# Patient Record
Sex: Male | Born: 1965 | Race: White | Hispanic: No | Marital: Married | Smoking: Never smoker
Health system: Southern US, Community
[De-identification: ages and names within clinical notes are randomized; demographics above are authoritative.]

## PROBLEM LIST (undated history)

## (undated) DIAGNOSIS — N400 Enlarged prostate without lower urinary tract symptoms: Secondary | ICD-10-CM

## (undated) DIAGNOSIS — I10 Essential (primary) hypertension: Secondary | ICD-10-CM

## (undated) HISTORY — DX: Benign prostatic hyperplasia without lower urinary tract symptoms: N40.0

## (undated) HISTORY — PX: COLONOSCOPY: SHX174

## (undated) HISTORY — PX: PROSTATE BIOPSY: SHX241

## (undated) HISTORY — PX: VASECTOMY: SHX75

---

## 2008-09-17 ENCOUNTER — Ambulatory Visit: Payer: Self-pay | Admitting: Gastroenterology

## 2016-05-03 DIAGNOSIS — C801 Malignant (primary) neoplasm, unspecified: Secondary | ICD-10-CM

## 2016-05-03 HISTORY — DX: Malignant (primary) neoplasm, unspecified: C80.1

## 2016-05-11 ENCOUNTER — Encounter: Payer: Self-pay | Admitting: *Deleted

## 2016-05-12 ENCOUNTER — Other Ambulatory Visit (HOSPITAL_COMMUNITY): Payer: Self-pay | Admitting: Urology

## 2016-05-12 DIAGNOSIS — R972 Elevated prostate specific antigen [PSA]: Secondary | ICD-10-CM

## 2016-05-19 ENCOUNTER — Ambulatory Visit (HOSPITAL_COMMUNITY)
Admission: RE | Admit: 2016-05-19 | Discharge: 2016-05-19 | Disposition: A | Discharge status: Home / Self Care | Payer: BLUE CROSS/BLUE SHIELD | Source: Ambulatory Visit | Attending: Urology | Admitting: Urology

## 2016-05-19 DIAGNOSIS — R972 Elevated prostate specific antigen [PSA]: Secondary | ICD-10-CM | POA: Diagnosis present

## 2016-05-19 DIAGNOSIS — R9349 Abnormal radiologic findings on diagnostic imaging of other urinary organs: Secondary | ICD-10-CM | POA: Insufficient documentation

## 2016-05-19 MED ORDER — LIDOCAINE HCL 2 % EX GEL: 1.0000 "application " | Freq: Once | CUTANEOUS | Status: DC

## 2016-05-19 MED ORDER — LIDOCAINE HCL 2 % EX GEL
CUTANEOUS | Status: AC
  Filled 2016-05-19: qty 30

## 2016-05-19 MED ORDER — GADOBENATE DIMEGLUMINE 529 MG/ML IV SOLN
20.0000 mL | Freq: Once | INTRAVENOUS | Status: AC | PRN
  Administered 2016-05-19: 20 mL via INTRAVENOUS

## 2016-06-01 ENCOUNTER — Encounter: Payer: Self-pay | Admitting: General Surgery

## 2016-06-01 ENCOUNTER — Ambulatory Visit (INDEPENDENT_AMBULATORY_CARE_PROVIDER_SITE_OTHER): Payer: BLUE CROSS/BLUE SHIELD | Admitting: General Surgery

## 2016-06-01 VITALS — BP 168/88 | HR 78 | Resp 12 | Ht 71.0 in | Wt 218.0 lb

## 2016-06-01 DIAGNOSIS — K640 First degree hemorrhoids: Secondary | ICD-10-CM

## 2016-06-01 DIAGNOSIS — K602 Anal fissure, unspecified: Secondary | ICD-10-CM

## 2016-06-01 MED ORDER — HYDROCORTISONE ACETATE 25 MG RE SUPP: 25.0000 mg | Freq: Two times a day (BID) | RECTAL | 0 refills | Status: DC

## 2016-06-01 MED ORDER — HYDROCORTISONE ACE-PRAMOXINE 2.5-1 % RE CREA: 1.0000 "application " | TOPICAL_CREAM | Freq: Two times a day (BID) | RECTAL | 0 refills | Status: DC

## 2016-06-01 NOTE — Progress Notes (Signed)
Patient ID: Stephen Joyce, male   DOB: [birth year], 51 y.o.   MRN: [id number]  Chief Complaint  Patient presents with  . Rectal Pain    HPI Stephen Joyce is a 51 y.o. male.  here today for evaluation of possible fissure or hemorrhoids. He is having some rectal pain with burning minimal bleeding. He states his PSA has been elevated prostate biopsy July 2017 by Dr Yves Dill. This did not show evidence of malignancy.   He states that about 2 weeks after the biopsy is when his started having rectal pain.Marland Kitchen He states the pain would get better the it would start again. Pain typically starts during the bowel movement when he describes it as if he is passing broken glass, occasionally able start about 20-30 minutes after BM.Symptoms are worse with hard stools. Prostate MRI done 05-19-16 showed an area of questionable change potentially warranting repeat biopsy.  Bowels move regular but associated with pain. Long periods of sitting as when he drives to [facility] can be uncomfortable.  HPI  Past Medical History:  Diagnosis Date  . Enlarged prostate     History reviewed. No pertinent surgical history.  Family History  Problem Relation Age of Onset  . Prostate cancer Father     Social History Social History  Substance Use Topics  . Smoking status: Never Smoker  . Smokeless tobacco: Never Used  . Alcohol use Yes    No Known Allergies  Current Outpatient Prescriptions  Medication Sig Dispense Refill  . amLODipine-olmesartan (AZOR) 5-20 MG tablet daily.   1  . Nutritional Supplements (PROSTATE 2.4 PO) Take 2 tablets by mouth daily.    . Omega-3 Fatty Acids (THEROMEGA PO) Take 700 mg by mouth daily.    . simvastatin (ZOCOR) 20 MG tablet Take 20 mg by mouth daily at 6 PM.   1  . Soybean (ISOREL PO) Take 100 mg by mouth daily.    . hydrocortisone (ANUSOL-HC) 25 MG suppository Place 1 suppository (25 mg total) rectally 2 (two) times daily. 12 suppository 0  . hydrocortisone-pramoxine  (ANALPRAM HC) 2.5-1 % rectal cream Place 1 application rectally 2 (two) times daily. 30 g 0   No current facility-administered medications for this visit.     Review of Systems Review of Systems  Constitutional: Negative.   Respiratory: Negative.   Cardiovascular: Negative.   Gastrointestinal: Positive for rectal pain.    Blood pressure (!) 168/88, pulse 78, resp. rate 12, height 5\' 11"  (1.803 m), weight 218 lb (98.9 kg).  Physical Exam Physical Exam  Constitutional: He is oriented to person, place, and time. He appears well-developed and well-nourished.  HENT:  Mouth/Throat: Oropharynx is clear and moist.  Eyes: Conjunctivae are normal. No scleral icterus.  Neck: Neck supple.  Cardiovascular: Normal rate, regular rhythm and normal heart sounds.   Pulmonary/Chest: Effort normal and breath sounds normal.  Abdominal: Soft. Normal appearance.  Genitourinary: Rectal exam shows internal hemorrhoid and fissure.     Genitourinary Comments: Anoscopy was completed after 5 mL of 2% Xylocaine jelly. Mildly prominent internal hemorrhoids posteriorly. Prominent posterior fissure.  Lymphadenopathy:    He has no cervical adenopathy.  Neurological: He is alert and oriented to person, place, and time.  Skin: Skin is warm and dry.  Psychiatric: His behavior is normal.    Data Reviewed Pelvic MRI reported 05/19/2016.  Assessment    Posterior anal fissure, mild inflammation of the posterior internal hemorrhoids.    Plan    The patient has been  encouraged to make use of a fiber supplement to minimize hard stools. He'll apply Analpram cream to the external fissure 2-3 times per day. Anusol suppositories are being prescribed to resolve some of the inflammation above the level of the fissure.  Anticipated repeat exam in 3 weeks.  Surgical versus Botox options reviewed if conservative measures don't show study progress.       This information has been scribed by Karie Fetch RN,  BSN,BC.   Robert Bellow 06/01/2016, 11:41 AM

## 2016-06-01 NOTE — Patient Instructions (Addendum)
The patient is aware to call back for any questions or concerns.   Recommend fiber supplement to diet  Anal Fissure, Adult Introduction An anal fissure is a small tear or crack in the skin around the opening of the butt (anus).Bleeding from the tear or crack usually stops on its own within a few minutes. The bleeding may happen every time you poop (have a bowel movement) until the tear or crack heals. Follow these instructions at home: Eating and drinking  Avoid bananas and dairy products. These foods can make it hard to poop.  Drink enough fluid to keep your pee (urine) clear or pale yellow.  Eat a lot of fruit, whole grains, and vegetables. General instructions  Keep the butt area as clean and dry as you can.  Take a warm water bath (sitz bath) as told by your doctor. Do not use soap.  Take over-the-counter and prescription medicines only as told by your doctor.  Use creams or ointments only as told by your doctor.  Keep all follow-up visits as told by your doctor. This is important. Contact a doctor if:  You have more bleeding.  You have a fever.  You have watery poop (diarrhea) that is mixed with blood.  You have pain.  You problem gets worse, not better. This information is not intended to replace advice given to you by your health care provider. Make sure you discuss any questions you have with your health care provider. Document Released: 12/16/2010 Document Revised: 09/25/2015 Document Reviewed: 07/15/2014  2017 Elsevier

## 2016-06-16 ENCOUNTER — Ambulatory Visit: Payer: BLUE CROSS/BLUE SHIELD | Admitting: General Surgery

## 2016-08-12 ENCOUNTER — Encounter: Payer: Self-pay | Admitting: *Deleted

## 2016-08-31 ENCOUNTER — Inpatient Hospital Stay: Admission: RE | Admit: 2016-08-31 | Payer: BLUE CROSS/BLUE SHIELD | Source: Ambulatory Visit

## 2016-09-01 ENCOUNTER — Encounter
Admission: RE | Admit: 2016-09-01 | Discharge: 2016-09-01 | Disposition: A | Discharge status: Home / Self Care | Payer: BLUE CROSS/BLUE SHIELD | Source: Ambulatory Visit | Attending: Urology | Admitting: Urology

## 2016-09-01 HISTORY — DX: Essential (primary) hypertension: I10

## 2016-09-01 NOTE — Pre-Procedure Instructions (Signed)
COMPLETED PRE OP PHONE INTERVIEW BUT PT NEEDS TO COME IN FOR EKG DUE TO HTN-PT STATES HE IS OUT OF TOWN AND WILL NOT BE BACK UNTIL Sunday-PT WILL COME IN Monday (09-06-16) FOR EKG

## 2016-09-01 NOTE — Patient Instructions (Signed)
  Your procedure is scheduled on: 09-07-16 (Tuesday) Report to Same Day Surgery 2nd floor medical mall [facility] Entrance-take elevator on left to 2nd floor.  Check in with surgery information desk.) To find out your arrival time please call [phone] between 1PM - 3PM on 09-06-16 (Monday)  Remember: Instructions that are not followed completely may result in serious medical risk, up to and including death, or upon the discretion of your surgeon and anesthesiologist your surgery may need to be rescheduled.    _x___ 1. Do not eat food or drink liquids after midnight. No gum chewing or hard candies.     __x__ 2. No Alcohol for 24 hours before or after surgery.   __x__3. No Smoking for 24 prior to surgery.   ____  4. Bring all medications with you on the day of surgery if instructed.    __x__ 5. Notify your doctor if there is any change in your medical condition     (cold, fever, infections).     Do not wear jewelry, make-up, hairpins, clips or nail polish.  Do not wear lotions, powders, or perfumes. You may wear deodorant.  Do not shave 48 hours prior to surgery. Men may shave face and neck.  Do not bring valuables to the hospital.    [facility] is not responsible for any belongings or valuables.               Contacts, dentures or bridgework may not be worn into surgery.  Leave your suitcase in the car. After surgery it may be brought to your room.  For patients admitted to the hospital, discharge time is determined by your  treatment team.   Patients discharged the day of surgery will not be allowed to drive home.  You will need someone to drive you home and stay with you the night of your procedure.    Please read over the following fact sheets that you were given:     _x___ Take anti-hypertensive (unless it includes a diuretic), cardiac, seizure, asthma,     anti-reflux and psychiatric medicines. These include:  1. AZOR (AMLODIPINE-OLMESARTAN)  2. ZOCOR  (SIMVASTATIN)  3.  4.  5.  6.  _X___Fleets enema or Magnesium Citrate as directed-AT HOME 1 HOUR PRIOR TO [place]   ____ Use CHG Soap or sage wipes as directed on instruction sheet   ____ Use inhalers on the day of surgery and bring to hospital day of surgery  ____ Stop Metformin and Janumet 2 days prior to surgery.    ____ Take 1/2 of usual insulin dose the night before surgery and none on the morning  surgery.   ____ Follow recommendations from Cardiologist, Pulmonologist or PCP regarding stopping Aspirin, Coumadin, Pllavix ,Eliquis, Effient, or Pradaxa, and Pletal.  X____Stop Anti-inflammatories such as Advil, Aleve, Ibuprofen, Motrin, Naproxen, Naprosyn, Goodies powders or aspirin products NOW-OK to take Tylenol    _x___ Stop supplements until after surgery-STOP FISH OIL AND SOY NOW   ____ Bring C-Pap to the hospital.

## 2016-09-03 NOTE — H&P (Signed)
NAME:  Stephen Joyce NO.:  MEDICAL RECORD NO.:  [id number]  LOCATION:                                 FACILITY:  PHYSICIAN:  Maryan Puls          DATE OF BIRTH:  [birth year]  DATE OF ADMISSION: DATE OF DISCHARGE:                            HISTORY AND PHYSICAL   SAME-DAY SURGERY:  Sep 07, 2016.  CHIEF COMPLAINT:  Elevated PSA.  HISTORY OF PRESENT ILLNESS:  [name] is a 52 year old white male who presented with an elevated PSA of 6.8 ng/mL in May 2017.  PSA increased to 8.3 ng/mL on May 06, 2016.  The patient underwent transrectal ultrasound guided biopsy of the prostate in June 2017, which indicated a 23.5 g prostate with presence of atypia of the right lateral apex and right lateral midportion of the prostate.  He had a followup MRI scan of the prostate on May 19, 2016, which indicated a 9 mm PI- RADS grade 3 lesion of the left base.  The patient comes in now for targeted prostate biopsy using the UroNav system.  ALLERGIES:  NO DRUG ALLERGIES.  MEDICATIONS:  No medications.  SURGICAL HISTORY:  Bilateral vasectomy in 1994.  SOCIAL HISTORY:  The patient consumes 4-6 alcoholic beverages per week. Denies cigarette use.  FAMILY HISTORY:  Father is living at age 23 after treatment for prostate cancer.  Mother is living at age 52 without health problems.  PAST AND CURRENT MEDICAL CONDITIONS:  The patient denied heart disease, lung disease, diabetes, stroke, or hypertension.  REVIEW OF SYSTEMS:  Negative for prior urological disease, hematuria, dysuria, or difficulty voiding.  PHYSICAL EXAMINATION:  GENERALLY:  Well-nourished white male, in no acute distress. HEENT:  Sclerae were clear. NECK:  Supple.  No palpable cervical adenopathy. LUNGS:  Clear to auscultation. CARDIOVASCULAR:  Regular rhythm and rate without audible murmurs. ABDOMEN:  Soft, nontender abdomen. GU:  Circumcised.  Testes smooth, nontender, 20 mL size each. RECTAL:   Internal hemorrhoids.  Prostate gland 25 g, smooth, nontender. NEUROMUSCULAR:  Nonfocal.  IMPRESSION: 1. Elevated prostate-specific antigen. 2. History of atypia. 3. A 9 mm PI-RADS grade 3 lesion of the left base on CT scan.  PLAN:  Targeted biopsy using the UroNav system.          ______________________________ Maryan Puls     MW/MEDQ  D:  09/02/2016  T:  09/02/2016  Job:  [id number]

## 2016-09-06 ENCOUNTER — Encounter
Admission: RE | Admit: 2016-09-06 | Discharge: 2016-09-06 | Disposition: A | Discharge status: Home / Self Care | Payer: BLUE CROSS/BLUE SHIELD | Source: Ambulatory Visit | Attending: Urology | Admitting: Urology

## 2016-09-06 DIAGNOSIS — K649 Unspecified hemorrhoids: Secondary | ICD-10-CM | POA: Diagnosis not present

## 2016-09-06 DIAGNOSIS — R972 Elevated prostate specific antigen [PSA]: Secondary | ICD-10-CM | POA: Diagnosis not present

## 2016-09-06 DIAGNOSIS — N4289 Other specified disorders of prostate: Secondary | ICD-10-CM | POA: Diagnosis not present

## 2016-09-06 DIAGNOSIS — Z9852 Vasectomy status: Secondary | ICD-10-CM | POA: Diagnosis not present

## 2016-09-06 DIAGNOSIS — I10 Essential (primary) hypertension: Secondary | ICD-10-CM | POA: Diagnosis not present

## 2016-09-06 DIAGNOSIS — Z8042 Family history of malignant neoplasm of prostate: Secondary | ICD-10-CM | POA: Diagnosis not present

## 2016-09-07 ENCOUNTER — Ambulatory Visit: Payer: BLUE CROSS/BLUE SHIELD | Admitting: Anesthesiology

## 2016-09-07 ENCOUNTER — Ambulatory Visit
Admission: RE | Admit: 2016-09-07 | Discharge: 2016-09-07 | Disposition: A | Discharge status: Home / Self Care | Payer: BLUE CROSS/BLUE SHIELD | Source: Ambulatory Visit | Attending: Urology | Admitting: Urology

## 2016-09-07 ENCOUNTER — Encounter
Admission: RE | Disposition: A | Discharge status: Home / Self Care | Payer: Self-pay | Source: Ambulatory Visit | Attending: Urology

## 2016-09-07 DIAGNOSIS — I10 Essential (primary) hypertension: Secondary | ICD-10-CM | POA: Insufficient documentation

## 2016-09-07 DIAGNOSIS — R972 Elevated prostate specific antigen [PSA]: Secondary | ICD-10-CM | POA: Insufficient documentation

## 2016-09-07 DIAGNOSIS — C61 Malignant neoplasm of prostate: Secondary | ICD-10-CM | POA: Insufficient documentation

## 2016-09-07 DIAGNOSIS — N4289 Other specified disorders of prostate: Secondary | ICD-10-CM | POA: Insufficient documentation

## 2016-09-07 DIAGNOSIS — Z9852 Vasectomy status: Secondary | ICD-10-CM | POA: Insufficient documentation

## 2016-09-07 DIAGNOSIS — K649 Unspecified hemorrhoids: Secondary | ICD-10-CM | POA: Insufficient documentation

## 2016-09-07 DIAGNOSIS — Z8042 Family history of malignant neoplasm of prostate: Secondary | ICD-10-CM | POA: Insufficient documentation

## 2016-09-07 HISTORY — PX: PROSTATE BIOPSY: SHX241

## 2016-09-07 SURGERY — BIOPSY, PROSTATE
Anesthesia: General

## 2016-09-07 MED ORDER — FLEET ENEMA 7-19 GM/118ML RE ENEM
1.0000 | ENEMA | Freq: Once | RECTAL | Status: AC
  Administered 2016-09-07: 1 via RECTAL

## 2016-09-07 MED ORDER — LACTATED RINGERS IV SOLN
INTRAVENOUS | Status: DC
  Administered 2016-09-07: 13:00:00 via INTRAVENOUS

## 2016-09-07 MED ORDER — DOCUSATE SODIUM 100 MG PO CAPS: 200.0000 mg | ORAL_CAPSULE | Freq: Two times a day (BID) | ORAL | 3 refills | Status: DC

## 2016-09-07 MED ORDER — LEVOFLOXACIN 500 MG PO TABS: 500.0000 mg | ORAL_TABLET | Freq: Every day | ORAL | 0 refills | Status: AC

## 2016-09-07 MED ORDER — PROPOFOL 500 MG/50ML IV EMUL
INTRAVENOUS | Status: DC | PRN
  Administered 2016-09-07: 120 ug/kg/min via INTRAVENOUS

## 2016-09-07 MED ORDER — FAMOTIDINE 20 MG PO TABS
ORAL_TABLET | ORAL | Status: AC
  Administered 2016-09-07: 20 mg via ORAL
  Filled 2016-09-07: qty 1

## 2016-09-07 MED ORDER — MIDAZOLAM HCL 2 MG/2ML IJ SOLN
INTRAMUSCULAR | Status: AC
  Filled 2016-09-07: qty 2

## 2016-09-07 MED ORDER — LIDOCAINE HCL 2 % EX GEL
CUTANEOUS | Status: AC
  Filled 2016-09-07: qty 10

## 2016-09-07 MED ORDER — LIDOCAINE HCL (CARDIAC) 20 MG/ML IV SOLN
INTRAVENOUS | Status: DC | PRN
  Administered 2016-09-07: 60 mg via INTRAVENOUS

## 2016-09-07 MED ORDER — GENTAMICIN SULFATE 40 MG/ML IJ SOLN
80.0000 mg | Freq: Once | INTRAVENOUS | Status: AC
  Administered 2016-09-07: 80 mg via INTRAVENOUS
  Filled 2016-09-07: qty 2

## 2016-09-07 MED ORDER — FENTANYL CITRATE (PF) 100 MCG/2ML IJ SOLN: 25.0000 ug | INTRAMUSCULAR | Status: DC | PRN

## 2016-09-07 MED ORDER — PROPOFOL 500 MG/50ML IV EMUL
INTRAVENOUS | Status: AC
  Filled 2016-09-07: qty 50

## 2016-09-07 MED ORDER — PROPOFOL 10 MG/ML IV BOLUS
INTRAVENOUS | Status: DC | PRN
  Administered 2016-09-07: 40 mg via INTRAVENOUS

## 2016-09-07 MED ORDER — HYDROCODONE-ACETAMINOPHEN 10-325 MG PO TABS: 1.0000 | ORAL_TABLET | ORAL | 0 refills | Status: DC | PRN

## 2016-09-07 MED ORDER — LIDOCAINE HCL (PF) 2 % IJ SOLN
INTRAMUSCULAR | Status: AC
  Filled 2016-09-07: qty 2

## 2016-09-07 MED ORDER — ONDANSETRON HCL 4 MG/2ML IJ SOLN: 4.0000 mg | Freq: Once | INTRAMUSCULAR | Status: DC | PRN

## 2016-09-07 MED ORDER — MIDAZOLAM HCL 2 MG/2ML IJ SOLN
INTRAMUSCULAR | Status: DC | PRN
  Administered 2016-09-07: .5 mg via INTRAVENOUS
  Administered 2016-09-07: 2 mg via INTRAVENOUS
  Administered 2016-09-07: .5 mg via INTRAVENOUS
  Administered 2016-09-07: 1 mg via INTRAVENOUS

## 2016-09-07 MED ORDER — PROPOFOL 10 MG/ML IV BOLUS
INTRAVENOUS | Status: AC
  Filled 2016-09-07: qty 20

## 2016-09-07 MED ORDER — FAMOTIDINE 20 MG PO TABS
20.0000 mg | ORAL_TABLET | Freq: Once | ORAL | Status: AC
  Administered 2016-09-07: 20 mg via ORAL

## 2016-09-07 SURGICAL SUPPLY — 27 items
BAG URO DRAIN 2000ML W/SPOUT (MISCELLANEOUS) IMPLANT
BLADE CLIPPER SURG (BLADE) IMPLANT
COVER TRANSDUCER ULTRASOUND (MISCELLANEOUS) ×2 IMPLANT
DRAPE SHEET LG 3/4 BI-LAMINATE (DRAPES) IMPLANT
DRSG TELFA 3X8 NADH (GAUZE/BANDAGES/DRESSINGS) IMPLANT
DRSG TELFA 4X3 1S NADH ST (GAUZE/BANDAGES/DRESSINGS) IMPLANT
GAUZE SPONGE 4X4 12PLY STRL (GAUZE/BANDAGES/DRESSINGS) IMPLANT
GLOVE BIO SURGEON STRL SZ 6.5 (GLOVE) IMPLANT
GLOVE BIO SURGEON STRL SZ7 (GLOVE) IMPLANT
GLOVE BIO SURGEONS STRL SZ 6.5 (GLOVE)
GUIDE NEEDLE ENDOCAV 16-18 CVR (NEEDLE) ×3 IMPLANT
INST BIOPSY MAXCORE 18GX25 (NEEDLE) ×3 IMPLANT
KIT RM TURNOVER CYSTO AR (KITS) ×3 IMPLANT
NDL DEFLUX 3.7X23X350 (MISCELLANEOUS)
NDL SAFETY 18GX1.5 (NEEDLE) IMPLANT
NDL SAFETY 22GX1.5 (NEEDLE) IMPLANT
NEEDLE BIO TRUPATH DISP 18GX25 (MISCELLANEOUS) ×6 IMPLANT
NEEDLE DEFLUX 3.7X23X350 (MISCELLANEOUS) IMPLANT
NEEDLE GUIDE BIOPSY 644068 (NEEDLE) IMPLANT
PAD DRESSING TELFA 3X8 NADH (GAUZE/BANDAGES/DRESSINGS) IMPLANT
PROBE URONAV BK 8808E 8818 HLD (MISCELLANEOUS) ×1 IMPLANT
SYRINGE 10CC LL (SYRINGE) IMPLANT
SYRINGE IRR TOOMEY STRL 70CC (SYRINGE) IMPLANT
URONAV BK 8808E 8818 PROBE HLD (MISCELLANEOUS) ×2
URONAV MRI FUSION TWO PATIENTS (MISCELLANEOUS) ×3 IMPLANT
URONAV ULTRASOUND (MISCELLANEOUS) ×2 IMPLANT
WATER STERILE IRR 1000ML POUR (IV SOLUTION) IMPLANT

## 2016-09-07 NOTE — Anesthesia Post-op Follow-up Note (Cosign Needed)
Anesthesia QCDR form completed.        

## 2016-09-07 NOTE — Discharge Instructions (Signed)
AMBULATORY SURGERY  DISCHARGE INSTRUCTIONS   1) The drugs that you were given will stay in your system until tomorrow so for the next 24 hours you should not:  A) Drive an automobile B) Make any legal decisions C) Drink any alcoholic beverage   2) You may resume regular meals tomorrow.  Today it is better to start with liquids and gradually work up to solid foods.  You may eat anything you prefer, but it is better to start with liquids, then soup and crackers, and gradually work up to solid foods.   3) Please notify your doctor immediately if you have any unusual bleeding, trouble breathing, redness and pain at the surgery site, drainage, fever, or pain not relieved by medication. 4)   5) Your post-operative visit with Dr.                                     is: Date:                        Time:    Please call to schedule your post-operative visit.  6) Additional Instructions:        Transrectal Ultrasound-Guided Biopsy A transrectal ultrasound-guided biopsy is a procedure to remove samples of tissue from your prostate using ultrasound images to guide the procedure. The procedure is usually done to evaluate the prostate gland of men who have an elevated prostate-specific antigen (PSA). PSA is a blood test to screen for prostate cancer. The biopsy samples are taken to check for prostate cancer. Tell a health care provider about:  Any allergies you have.  All medicines you are taking, including vitamins, herbs, eye drops, creams, and over-the-counter medicines.  Any problems you or family members have had with anesthetic medicines.  Any blood disorders you have.  Any surgeries you have had.  Any medical conditions you have. What are the risks? Generally, this is a safe procedure. However, as with any procedure, problems can occur. Possible problems include:  Infection of your prostate.  Bleeding from your rectum or blood in your urine.  Difficulty  urinating.  Nerve damage (this is usually temporary).  Damage to surrounding structures such as blood vessels, organs, and muscles, which would require other procedures. What happens before the procedure?  Do not eat or drink anything after midnight on the night before the procedure or as directed by your health care provider.  Take medicines only as directed by your health care provider.  Your health care provider may have you stop taking certain medicines 5-7 days before the procedure.  You will be given an enema before the procedure. During an enema, a liquid is injected into your rectum to clear out waste.  You may have lab tests the day of your procedure.  Plan to have someone take you home after the procedure. What happens during the procedure?  You will be given medicine to help you relax (sedative) before the procedure. An IV tube will be inserted into one of your veins and used to give fluids and medicine.  You will be given antibiotic medicine to reduce the risk of an infection.  You will be placed on your side for the procedure.  A probe with lubricated gel will be placed into your rectum, and images will be taken of your prostate and surrounding structures.  Numbing medicine will be injected into the prostate before  the biopsy samples are taken.  A biopsy needle will then be inserted and guided to your prostate with the use of the ultrasound images.  Samples of prostate tissue will be taken, and the needle will then be removed.  The biopsy samples will be sent to a lab to be analyzed. Results are usually back in 2-3 days. What happens after the procedure?  You will be taken to a recovery area where you will be monitored.  You may have some discomfort in the rectal area. You will be given pain medicines to control this.  You may be allowed to go home the same day, or you may need to stay in the hospital overnight. This information is not intended to replace advice  given to you by your health care provider. Make sure you discuss any questions you have with your health care provider. Document Released: 09/03/2013 Document Revised: 09/25/2015 Document Reviewed: 12/06/2012 Elsevier Interactive Patient Education  2017 [employer].

## 2016-09-07 NOTE — Anesthesia Postprocedure Evaluation (Signed)
Anesthesia Post Note  Patient: Stephen Joyce  Procedure(s) Performed: Procedure(s) (LRB): PROSTATE BIOPSY WITH URO NAVIGATION (N/A)  Patient location during evaluation: PACU Anesthesia Type: General Level of consciousness: awake and alert and oriented Pain management: pain level controlled Vital Signs Assessment: post-procedure vital signs reviewed and stable Respiratory status: spontaneous breathing Cardiovascular status: blood pressure returned to baseline Anesthetic complications: no     Last Vitals:  Vitals:   09/07/16 1523 09/07/16 1538  BP: 102/69 104/64  Pulse: (!) 55 60  Resp: 12 16  Temp: 36.2 C     Last Pain:  Vitals:   09/07/16 1538  TempSrc:   PainSc: 0-No pain                 Darica Goren

## 2016-09-07 NOTE — OR Nursing (Signed)
[place] regional rep and technician in the [place] Dr. Yves Dill.  Kandy Garrison and [employer]. Dr. Yves Dill brought his own pre labeled specimen kit. He took 13 specimens and put a specimen in each container. Dr. Yves Dill carried all the specimens with him when he left the OR.

## 2016-09-07 NOTE — Op Note (Signed)
Preoperative diagnosis:1. 9 mm PI RADS grade 3 lesion left base                                           2. Elevated PSA                                           3. Atypia of the right lateral apex and right lateral                                                        midportion of the prostate  Postoperative diagnosis: Same   Procedure: Fusion MRI transrectal ultrasound-guided prostate biopsy using the UroNav guidance system    Surgeon: Otelia Limes. Yves Dill MD  Anesthesia: General  Indications:See the history and physical. After informed consent the above procedure(s) were requested     Technique and findings: After adequate general anesthesia been obtained patient was placed into the lateral decubitus position and finger sweep of the rectal vault performed. The rectal vault was clear. The transrectal ultrasound probe was then placed and transverse and sagittal ultrasound images acquired. The MRI images were then superimposed upon the ultrasound images and the region of interest at the left base was identified. Three core biopsies were obtained of this region. At this point standard 12 core regional biopsies were obtained. At this point the ultrasound probe was removed and procedure terminated. The patient tolerated procedure well and was transferred to the recovery room stable condition. Blood loss was minimal.

## 2016-09-07 NOTE — H&P (Signed)
Date of Initial H&P: 09/02/16  History reviewed, patient examined, no change in status, stable for surgery.

## 2016-09-07 NOTE — Transfer of Care (Signed)
Immediate Anesthesia Transfer of Care Note  Patient: Stephen Joyce  Procedure(s) Performed: Procedure(s): PROSTATE BIOPSY WITH URO NAVIGATION (N/A)  Patient Location: PACU  Anesthesia Type:General  Level of Consciousness: sedated  Airway & Oxygen Therapy: Patient Spontanous Breathing and Patient connected to face mask oxygen  Post-op Assessment: Report given to RN and Post -op Vital signs reviewed and stable  Post vital signs: Reviewed and stable  Last Vitals:  Vitals:   09/07/16 1318 09/07/16 1523  BP: (!) 155/84 102/69  Pulse:    Resp:  12  Temp:  36.2 C    Last Pain:  Vitals:   09/07/16 1317  TempSrc: Tympanic         Complications: No apparent anesthesia complications

## 2016-09-07 NOTE — Anesthesia Preprocedure Evaluation (Addendum)
Anesthesia Evaluation  Patient identified by MRN, date of birth, ID band Patient awake    Reviewed: Allergy & Precautions, NPO status , Patient's Chart, lab work & pertinent test results  Airway Mallampati: II  TM Distance: <3 FB     Dental no notable dental hx.    Pulmonary neg pulmonary ROS,    Pulmonary exam normal        Cardiovascular hypertension, Pt. on medications Normal cardiovascular exam     Neuro/Psych negative neurological ROS  negative psych ROS   GI/Hepatic Neg liver ROS, hemorrhoids   Endo/Other  negative endocrine ROS  Renal/GU negative Renal ROS     Musculoskeletal negative musculoskeletal ROS (+)   Abdominal Normal abdominal exam  (+)   Peds negative pediatric ROS (+)  Hematology negative hematology ROS (+)   Anesthesia Other Findings Past Medical History: No date: Enlarged prostate No date: Hypertension  Reproductive/Obstetrics                            Anesthesia Physical Anesthesia Plan  ASA: II  Anesthesia Plan: General   Post-op Pain Management:    Induction: Intravenous  Airway Management Planned: Nasal Cannula  Additional Equipment:   Intra-op Plan:   Post-operative Plan:   Informed Consent: I have reviewed the patients History and Physical, chart, labs and discussed the procedure including the risks, benefits and alternatives for the proposed anesthesia with the patient or authorized representative who has indicated his/her understanding and acceptance.   Dental advisory given  Plan Discussed with: CRNA and Surgeon  Anesthesia Plan Comments:         Anesthesia Quick Evaluation

## 2016-09-08 ENCOUNTER — Encounter: Payer: Self-pay | Admitting: Urology

## 2016-09-09 ENCOUNTER — Encounter: Payer: Self-pay | Admitting: Urology

## 2017-01-19 NOTE — H&P (Signed)
NAMEANDERS, HOHMANN NO.:  [id number]  MEDICAL RECORD NO.:  [id number]  LOCATION:                                 FACILITY:  PHYSICIAN:  Maryan Puls          DATE OF BIRTH:  [birth year]  DATE OF ADMISSION: DATE OF DISCHARGE:                            HISTORY AND PHYSICAL   SAME-DAY SURGERY:  January 20, 2017.  CHIEF COMPLAINT:  Difficulty voiding.  HISTORY OF PRESENT ILLNESS:  Mr. 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  He developed urethral stricture disease, which was treated with dilatation on August 30th.  Stricture has recurred and he comes in now for cysto with internal urethrotomy using holmium laser and dilatation.  He is having difficulty voiding at the present time.  ALLERGIES:  NO DRUG ALLERGIES.  MEDICATIONS:  No medications.  SURGICAL HISTORY:  Bilateral vasectomy in 1994.  SOCIAL HISTORY:  The patient consumes 4-6 alcoholic beverages per week. He denied cigarette use.  FAMILY HISTORY:  Father is living at age 46 after treatment for prostate cancer.  Mother is living, age 36, without any significant health problems.  PAST AND CURRENT MEDICAL CONDITIONS:  The patient denied history of heart disease, lung disease, diabetes, or hypertension.  REVIEW OF SYSTEMS:  The patient denied chest pain, shortness of breath, diarrhea, dysuria, or hematuria.  PHYSICAL EXAMINATION:  GENERAL:  A well-nourished white male, in no acute distress. HEENT:  Sclerae were clear. NECK:  Supple.  No palpable cervical adenopathy. LUNGS:  Clear to auscultation. CARDIOVASCULAR:  Regular rhythm and rate without audible murmurs. ABDOMEN:  Soft, nontender abdomen. GENITOURINARY:  Circumcised.  Testes smooth and nontender, 20 mL size each. RECTAL:  Internal hemorrhoids.  Prostate gland was less than 10 g, smooth and  nontender. NEUROMUSCULAR:  Nonfocal.  IMPRESSION:  Urethral stricture disease.  PLAN:  Cysto with internal urethrotomy using holmium laser and urethral dilatation.    ______________________________ Maryan Puls   ______________________________ Maryan Puls    MW/MEDQ  D:  01/19/2017  T:  01/19/2017  Job:  [id number]

## 2017-01-20 ENCOUNTER — Encounter
Admission: RE | Disposition: A | Discharge status: Home / Self Care | Payer: Self-pay | Source: Ambulatory Visit | Attending: Urology

## 2017-01-20 ENCOUNTER — Ambulatory Visit
Admission: RE | Admit: 2017-01-20 | Discharge: 2017-01-20 | Disposition: A | Discharge status: Home / Self Care | Payer: BLUE CROSS/BLUE SHIELD | Source: Ambulatory Visit | Attending: Urology | Admitting: Urology

## 2017-01-20 ENCOUNTER — Ambulatory Visit: Payer: BLUE CROSS/BLUE SHIELD | Admitting: Anesthesiology

## 2017-01-20 ENCOUNTER — Encounter: Payer: Self-pay | Admitting: *Deleted

## 2017-01-20 DIAGNOSIS — C61 Malignant neoplasm of prostate: Secondary | ICD-10-CM | POA: Diagnosis not present

## 2017-01-20 DIAGNOSIS — N359 Urethral stricture, unspecified: Secondary | ICD-10-CM | POA: Insufficient documentation

## 2017-01-20 DIAGNOSIS — N320 Bladder-neck obstruction: Secondary | ICD-10-CM | POA: Insufficient documentation

## 2017-01-20 HISTORY — PX: HOLMIUM LASER APPLICATION: SHX5852

## 2017-01-20 HISTORY — PX: CYSTOSCOPY WITH DIRECT VISION INTERNAL URETHROTOMY: SHX6637

## 2017-01-20 SURGERY — CYSTOSCOPY, WITH DIRECT VISION INTERNAL URETHROTOMY
Anesthesia: General | Wound class: Clean Contaminated

## 2017-01-20 MED ORDER — FENTANYL CITRATE (PF) 100 MCG/2ML IJ SOLN
INTRAMUSCULAR | Status: AC
  Filled 2017-01-20: qty 2

## 2017-01-20 MED ORDER — LIDOCAINE HCL 2 % EX GEL
CUTANEOUS | Status: DC | PRN
  Administered 2017-01-20: 1 via URETHRAL

## 2017-01-20 MED ORDER — MIDAZOLAM HCL 2 MG/2ML IJ SOLN
INTRAMUSCULAR | Status: AC
  Filled 2017-01-20: qty 2

## 2017-01-20 MED ORDER — URIBEL 118 MG PO CAPS: 1.0000 | ORAL_CAPSULE | Freq: Four times a day (QID) | ORAL | 3 refills | Status: DC | PRN

## 2017-01-20 MED ORDER — FENTANYL CITRATE (PF) 100 MCG/2ML IJ SOLN
INTRAMUSCULAR | Status: DC | PRN
  Administered 2017-01-20 (×2): 25 ug via INTRAVENOUS
  Administered 2017-01-20: 50 ug via INTRAVENOUS

## 2017-01-20 MED ORDER — CEFAZOLIN SODIUM-DEXTROSE 1-4 GM/50ML-% IV SOLN
1.0000 g | Freq: Once | INTRAVENOUS | Status: AC
  Administered 2017-01-20: 1 g via INTRAVENOUS

## 2017-01-20 MED ORDER — KETOROLAC TROMETHAMINE 30 MG/ML IJ SOLN
INTRAMUSCULAR | Status: AC
  Filled 2017-01-20: qty 1

## 2017-01-20 MED ORDER — DEXAMETHASONE SODIUM PHOSPHATE 10 MG/ML IJ SOLN
INTRAMUSCULAR | Status: DC | PRN
  Administered 2017-01-20: 10 mg via INTRAVENOUS

## 2017-01-20 MED ORDER — EPHEDRINE SULFATE 50 MG/ML IJ SOLN
INTRAMUSCULAR | Status: DC | PRN
  Administered 2017-01-20 (×2): 10 mg via INTRAVENOUS

## 2017-01-20 MED ORDER — ONDANSETRON HCL 4 MG/2ML IJ SOLN
INTRAMUSCULAR | Status: DC | PRN
  Administered 2017-01-20: 4 mg via INTRAVENOUS

## 2017-01-20 MED ORDER — LIDOCAINE HCL 2 % EX GEL
CUTANEOUS | Status: AC
  Filled 2017-01-20: qty 10

## 2017-01-20 MED ORDER — CEFAZOLIN SODIUM-DEXTROSE 1-4 GM/50ML-% IV SOLN
INTRAVENOUS | Status: AC
  Filled 2017-01-20: qty 50

## 2017-01-20 MED ORDER — KETOROLAC TROMETHAMINE 30 MG/ML IJ SOLN
INTRAMUSCULAR | Status: DC | PRN
  Administered 2017-01-20: 30 mg via INTRAVENOUS

## 2017-01-20 MED ORDER — LACTATED RINGERS IV SOLN
INTRAVENOUS | Status: DC
  Administered 2017-01-20: 15:00:00 via INTRAVENOUS

## 2017-01-20 MED ORDER — ONDANSETRON HCL 4 MG/2ML IJ SOLN
INTRAMUSCULAR | Status: AC
  Filled 2017-01-20: qty 2

## 2017-01-20 MED ORDER — DEXAMETHASONE SODIUM PHOSPHATE 10 MG/ML IJ SOLN
INTRAMUSCULAR | Status: AC
  Filled 2017-01-20: qty 1

## 2017-01-20 MED ORDER — PROPOFOL 10 MG/ML IV BOLUS
INTRAVENOUS | Status: DC | PRN
  Administered 2017-01-20: 200 mg via INTRAVENOUS

## 2017-01-20 MED ORDER — OXYCODONE-ACETAMINOPHEN 10-325 MG PO TABS: 1.0000 | ORAL_TABLET | ORAL | 0 refills | Status: DC | PRN

## 2017-01-20 MED ORDER — SULFAMETHOXAZOLE-TRIMETHOPRIM 800-160 MG PO TABS: 1.0000 | ORAL_TABLET | Freq: Two times a day (BID) | ORAL | 0 refills | Status: AC

## 2017-01-20 MED ORDER — LIDOCAINE HCL (CARDIAC) 20 MG/ML IV SOLN
INTRAVENOUS | Status: DC | PRN
  Administered 2017-01-20: 80 mg via INTRAVENOUS

## 2017-01-20 MED ORDER — PROPOFOL 10 MG/ML IV BOLUS
INTRAVENOUS | Status: AC
  Filled 2017-01-20: qty 20

## 2017-01-20 MED ORDER — LIDOCAINE HCL (PF) 2 % IJ SOLN
INTRAMUSCULAR | Status: AC
  Filled 2017-01-20: qty 2

## 2017-01-20 MED ORDER — FAMOTIDINE 20 MG PO TABS
ORAL_TABLET | ORAL | Status: AC
  Filled 2017-01-20: qty 1

## 2017-01-20 MED ORDER — FAMOTIDINE 20 MG PO TABS
20.0000 mg | ORAL_TABLET | Freq: Once | ORAL | Status: AC
  Administered 2017-01-20: 20 mg via ORAL

## 2017-01-20 MED ORDER — EPHEDRINE SULFATE 50 MG/ML IJ SOLN
INTRAMUSCULAR | Status: AC
  Filled 2017-01-20: qty 1

## 2017-01-20 MED ORDER — MIDAZOLAM HCL 2 MG/2ML IJ SOLN
INTRAMUSCULAR | Status: DC | PRN
  Administered 2017-01-20: 2 mg via INTRAVENOUS

## 2017-01-20 SURGICAL SUPPLY — 20 items
BAG DRAIN CYSTO-URO LG1000N (MISCELLANEOUS) ×3 IMPLANT
BAG URO DRAIN 2000ML W/SPOUT (MISCELLANEOUS) ×3 IMPLANT
CATH COUNCIL 22FR (CATHETERS) ×3 IMPLANT
CATH FOLEY 2WAY  5CC 20FR SIL (CATHETERS) ×2
CATH FOLEY 2WAY 5CC 20FR SIL (CATHETERS) ×1 IMPLANT
GLOVE BIO SURGEON STRL SZ7 (GLOVE) ×6 IMPLANT
GLOVE BIO SURGEON STRL SZ7.5 (GLOVE) ×3 IMPLANT
GLOVE BIOGEL PI IND STRL 7.0 (GLOVE) ×1 IMPLANT
GLOVE BIOGEL PI INDICATOR 7.0 (GLOVE) ×2
GOWN STRL REUS W/ TWL LRG LVL3 (GOWN DISPOSABLE) ×1 IMPLANT
GOWN STRL REUS W/ TWL XL LVL3 (GOWN DISPOSABLE) ×1 IMPLANT
GOWN STRL REUS W/TWL LRG LVL3 (GOWN DISPOSABLE) ×2
GOWN STRL REUS W/TWL XL LVL3 (GOWN DISPOSABLE) ×2
KIT RM TURNOVER CYSTO AR (KITS) ×3 IMPLANT
PACK CYSTO AR (MISCELLANEOUS) ×3 IMPLANT
SCALPEL LANCET BLADE (MISCELLANEOUS) ×3 IMPLANT
SENSOR STRAIGHT TIP WIRE ×3 IMPLANT
SET CYSTO W/LG BORE CLAMP LF (SET/KITS/TRAYS/PACK) ×3 IMPLANT
WATER STERILE IRR 1000ML POUR (IV SOLUTION) ×3 IMPLANT
WATER STERILE IRR 3000ML UROMA (IV SOLUTION) ×3 IMPLANT

## 2017-01-20 NOTE — Anesthesia Post-op Follow-up Note (Signed)
Anesthesia QCDR form completed.        

## 2017-01-20 NOTE — OR Nursing (Signed)
[place] Catheter attached to foley bag

## 2017-01-20 NOTE — Transfer of Care (Signed)
Immediate Anesthesia Transfer of Care Note  Patient: Stephen Joyce  Procedure(s) Performed: Procedure(s): CYSTOSCOPY INTERNAL URETHROTOMY (N/A) HOLMIUM LASER APPLICATION (N/A)  Patient Location: PACU  Anesthesia Type:General  Level of Consciousness: sedated  Airway & Oxygen Therapy: Patient Spontanous Breathing and Patient connected to face mask oxygen  Post-op Assessment: Report given to RN and Post -op Vital signs reviewed and stable  Post vital signs: Reviewed and stable  Last Vitals:  Vitals:   01/20/17 1403 01/20/17 1709  BP: (!) 157/84 113/63  Pulse: 69 (!) 58  Resp: 16 11  Temp: (!) 36.3 C (!) 36.3 C  SpO2: 99% 100%    Last Pain:  Vitals:   01/20/17 1403  TempSrc: Tympanic         Complications: No apparent anesthesia complications

## 2017-01-20 NOTE — Anesthesia Procedure Notes (Signed)
Procedure Name: LMA Insertion Performed by: Jaiden Wahab Pre-anesthesia Checklist: Patient identified, Patient being monitored, Timeout performed, Emergency Drugs available and Suction available Patient Re-evaluated:Patient Re-evaluated prior to induction Oxygen Delivery Method: Circle system utilized Preoxygenation: Pre-oxygenation with 100% oxygen Induction Type: IV induction Ventilation: Mask ventilation without difficulty LMA: LMA inserted LMA Size: 4.0 Tube type: Oral Number of attempts: 1 Placement Confirmation: positive ETCO2 and breath sounds checked- equal and bilateral Tube secured with: Tape Dental Injury: Teeth and Oropharynx as per pre-operative assessment        

## 2017-01-20 NOTE — Op Note (Signed)
Preoperative diagnosis: 1. Bladder neck contracture                                             2. Urethral stricture disease  Postoperative diagnosis: Same   Procedure: 1. Incision of bladder neck contracture with holmium laser                      2 incision of urethral stricture with holmium laser                      3. Foley catheter placement    Surgeon: Otelia Limes. Yves Dill MD  Anesthesia: General  Indications:See the history and physical. After informed consent the above procedure(s) were requested     Technique and findings: After adequate general anesthesia been obtained the patient was placed into dorsal lithotomy position and the perineum was prepped and draped in the usual fashion. The 1 French cystoscope was coupled to the camera and visually advanced into the bladder. Stricture was encountered in the prostatic urethra and bladder neck. However the caliber allowed passage of the 21 scope. A 0.0 35 guidewire was advanced into the bladder. The cystoscope was removed and then passed back into the bladder alongside the guidewire. The 1000  holmium laser fiber was then passed through the scope and set at a frequency of 10 and power level of 5 J. Bladder neck was incised at the 9:00 position and prosatic urethral stricture was incised at the 12:00 position. At this point the cystoscope and guidewire were removed. Urethra was then easily sounded to 61 [place] with [employer] sounds without any resistance. The 0.035 guidewire was then advanced back into the bladder using the cystoscope. The cystoscope was removed taking care to leave the guidewire in position. 10 cc of viscous Xylocaine was instilled within the urethra. A 22 French Counc[place] catheter was advanced over the guidewire into the bladder and the guidewire was removed. The procedure was then terminated and patient transferred to the recovery room in stable condition.

## 2017-01-20 NOTE — Discharge Instructions (Addendum)
Urethral Stricture Urethral stricture is narrowing of the tube (urethra) that carries urine from the bladder out of the body. The urethra can become narrow due to scar tissue from an injury or infection. This can make it difficult to pass urine. In women, the urethra opens above the vaginal opening. In men, the urethra opens at the tip of the penis, and the urethra is much longer than it is in women. Because of the length of the male urethra, urethral stricture is much more common in men. This condition is treated with surgery. What are the causes? Common causes of urethral stricture in men and women include:  Urinary tract infection (UTI).  Sexually transmitted infection (STI).  Use of a tube placed into the urethra to drain urine from the bladder (urinary catheter).  Urinary tract surgery.  In men, common causes of urethral stricture include:  A severe injury to the pelvis.  Prostate surgery.  Injury to the penis.  In many cases, the cause of urethral stricture may not be known. What increases the risk? Urethral stricture is more likely to develop in:  Men, especially men who have had prostate surgery.  People who use urinary catheters.  People who have had urinary tract surgery.  What are the signs or symptoms? The most common symptom of this condition is difficulty passing urine. This may cause decreased urine flow, dribbling, or spraying of urine. Other symptoms may include:  Frequent UTIs.  Blood in the urine.  Pain when urinating.  Swelling of the penis in men.  Inability to pass urine (urinary obstruction).  How is this diagnosed? This condition may be diagnosed based on:  Your medical history.  A physical exam.  Urine tests to check for infection or bleeding.  X-rays.  Ultrasound.  Retrograde urethrogram. This is a type of test in which dye is injected into the urethra and then an X-ray is taken.  Urethroscopy. This is when a thin tube with a light  and camera on the end (urethroscope) is used to look at the urethra.  How is this treated? This condition is treated with surgery. The type of surgery that you have depends on the severity of your condition. You may have:  Urethral dilation. In this procedure, the narrow part of the urethra is stretched open (dilated) with dilating instruments or a small balloon.  Urethrotomy. In this procedure, a urethroscope is placed into the urethra, and the narrow part of the urethra is cut open with a surgical blade inserted through the urethroscope.  Open surgery. In this procedure, an incision is made in the urethra, the narrow part is removed, and the urethra is reconstructed.  Follow these instructions at home:   Take over-the-counter and prescription medicines only as told by your health care provider.  If you were prescribed an antibiotic medicine, take it as told by your health care provider. Do not stop taking the antibiotic even if you start to feel better.  Drink enough fluid to keep your urine clear or pale yellow.  Keep all follow-up visits as told by your health care provider. This is important. Contact a health care provider if:  You have signs of a urinary tract infection, such as: ? Frequent urination or passing small amounts of urine frequently. ? Needing to urinate urgently. ? Pain or burning with urination. ? Urine that smells bad or unusual. ? Cloudy urine. ? Pain in the lower abdomen or back. ? Trouble urinating. ? Blood in the urine. ? Vomiting  or being less hungry than normal. ? Diarrhea or abdominal pain. ? Vaginal discharge, if you are male.  Your symptoms are getting worse instead of better. Get help right away if:  You cannot pass urine.  You have a fever.  You have swelling, bruising, or discoloration of your genital area. This includes the penis, scrotum, and inner thighs for men, and the outer genital organs (vulva) and inner thighs for women.  You  develop swelling in your legs.  You have difficulty breathing. This information is not intended to replace advice given to you by your health care provider. Make sure you discuss any questions you have with your health care provider. Document Released: 05/16/2015 Document Revised: 09/25/2015 Document Reviewed: 04/06/2015 Elsevier Interactive Patient Education  2018 [place]   1) The drugs that you were given will stay in your system until tomorrow so for the next 24 hours you should not:  A) Drive an automobile B) Make any legal decisions C) Drink any alcoholic beverage   2) You may resume regular meals tomorrow.  Today it is better to start with liquids and gradually work up to solid foods.  You may eat anything you prefer, but it is better to start with liquids, then soup and crackers, and gradually work up to solid foods.   3) Please notify your doctor immediately if you have any unusual bleeding, trouble breathing, redness and pain at the surgery site, drainage, fever, or pain not relieved by medication.    4) Additional Instructions:DRINK DRINK DRINK  Take care of foley catheter as instructed.  Remove on Monday.   Please contact your physician with any problems or Same Day Surgery at [phone], Monday through Friday 6 am to 4 pm, or [facility] number at [phone].

## 2017-01-20 NOTE — H&P (Signed)
Date of Initial H&P: 01/19/17  History reviewed, patient examined, no change in status, stable for surgery.

## 2017-01-21 ENCOUNTER — Encounter: Payer: Self-pay | Admitting: Urology

## 2017-01-21 NOTE — Anesthesia Postprocedure Evaluation (Signed)
Anesthesia Post Note  Patient: Stephen Joyce  Procedure(s) Performed: Procedure(s) (LRB): CYSTOSCOPY INTERNAL URETHROTOMY (N/A) HOLMIUM LASER APPLICATION (N/A)  Patient location during evaluation: PACU Anesthesia Type: General Level of consciousness: awake and alert Pain management: pain level controlled Vital Signs Assessment: post-procedure vital signs reviewed and stable Respiratory status: spontaneous breathing, nonlabored ventilation, respiratory function stable and patient connected to nasal cannula oxygen Cardiovascular status: blood pressure returned to baseline and stable Postop Assessment: no apparent nausea or vomiting Anesthetic complications: no     Last Vitals:  Vitals:   01/20/17 1739 01/20/17 1756  BP: 130/82 (!) 156/84  Pulse: 71 (!) 57  Resp: 16 15  Temp:  36.5 C  SpO2: 96% 99%    Last Pain:  Vitals:   01/21/17 0847  TempSrc:   PainSc: 0-No pain                 Molli Barrows

## 2017-01-21 NOTE — Anesthesia Preprocedure Evaluation (Signed)
Anesthesia Evaluation  Patient identified by MRN, date of birth, ID band Patient awake    Reviewed: Allergy & Precautions, H&P , NPO status , Patient's Chart, lab work & pertinent test results, reviewed documented beta blocker date and time   Airway Mallampati: III  TM Distance: >3 FB Neck ROM: full    Dental  (+) Teeth Intact   Pulmonary neg pulmonary ROS,    Pulmonary exam normal        Cardiovascular Exercise Tolerance: Good hypertension, On Medications negative cardio ROS Normal cardiovascular exam Rate:Normal     Neuro/Psych negative neurological ROS  negative psych ROS   GI/Hepatic negative GI ROS, Neg liver ROS,   Endo/Other  negative endocrine ROS  Renal/GU negative Renal ROS  negative genitourinary   Musculoskeletal   Abdominal   Peds  Hematology negative hematology ROS (+)   Anesthesia Other Findings   Reproductive/Obstetrics negative OB ROS                             Anesthesia Physical Anesthesia Plan  ASA: II  Anesthesia Plan: General LMA   Post-op Pain Management:    Induction:   PONV Risk Score and Plan:   Airway Management Planned:   Additional Equipment:   Intra-op Plan:   Post-operative Plan:   Informed Consent: I have reviewed the patients History and Physical, chart, labs and discussed the procedure including the risks, benefits and alternatives for the proposed anesthesia with the patient or authorized representative who has indicated his/her understanding and acceptance.       Plan Discussed with: CRNA  Anesthesia Plan Comments:         Anesthesia Quick Evaluation  

## 2019-07-16 ENCOUNTER — Telehealth: Payer: Self-pay

## 2019-07-16 NOTE — Telephone Encounter (Signed)
Spoke with patients wife scheduled new pulm appointment with Orson Gear scheduled this as virtual patient and wife will discuss with primary care provider about dates possible will change to in office if they call back. klh

## 2019-07-17 ENCOUNTER — Telehealth: Payer: Self-pay

## 2019-07-17 NOTE — Telephone Encounter (Signed)
Confirmed and screened pt for appt 07/19/19

## 2019-07-19 ENCOUNTER — Ambulatory Visit: Payer: BC Managed Care – PPO | Admitting: Internal Medicine

## 2019-07-19 ENCOUNTER — Other Ambulatory Visit: Payer: Self-pay

## 2019-07-19 ENCOUNTER — Telehealth: Payer: Self-pay

## 2019-07-19 VITALS — BP 127/66 | HR 75 | Temp 97.8°F | Resp 16 | Ht 71.0 in | Wt 212.0 lb

## 2019-07-19 DIAGNOSIS — R059 Cough, unspecified: Secondary | ICD-10-CM

## 2019-07-19 DIAGNOSIS — I10 Essential (primary) hypertension: Secondary | ICD-10-CM | POA: Diagnosis not present

## 2019-07-19 DIAGNOSIS — J1282 Pneumonia due to coronavirus disease 2019: Secondary | ICD-10-CM | POA: Diagnosis not present

## 2019-07-19 DIAGNOSIS — R05 Cough: Secondary | ICD-10-CM

## 2019-07-19 DIAGNOSIS — U071 COVID-19: Secondary | ICD-10-CM

## 2019-07-19 MED ORDER — BENZONATATE 200 MG PO CAPS: 200.0000 mg | ORAL_CAPSULE | Freq: Three times a day (TID) | ORAL | 0 refills | Status: DC | PRN

## 2019-07-19 MED ORDER — BUDESONIDE 0.5 MG/2ML IN SUSP: 0.5000 mg | Freq: Four times a day (QID) | RESPIRATORY_TRACT | 1 refills | Status: DC | PRN

## 2019-07-19 MED ORDER — HYDROCOD POLST-CPM POLST ER 10-8 MG/5ML PO SUER: 5.0000 mL | Freq: Two times a day (BID) | ORAL | 0 refills | Status: DC | PRN

## 2019-07-19 NOTE — Progress Notes (Signed)
[facility] [place], [place] [address]  Pulmonary Sleep Medicine   Office Visit Note  Patient Name: Stephen Joyce DOB: 02-Jun-1965 MRN [id number]  Date of Service: 07/19/2019  Complaints/HPI: Stephen Joyce is a 54 yo male who has a medical history remarkable for HTN, HLD, prostate cancer 2018.   He reports he had covid over a month ago.  He continues to have some DOE, and cough that is intermittently severe. He denies any fever or productivity to his cough.    ROS  General: (-) fever, (-) chills, (-) night sweats, (-) weakness Skin: (-) rashes, (-) itching,. Eyes: (-) visual changes, (-) redness, (-) itching. Nose and Sinuses: (-) nasal stuffiness or itchiness, (-) postnasal drip, (-) nosebleeds, (-) sinus trouble. Mouth and Throat: (-) sore throat, (-) hoarseness. Neck: (-) swollen glands, (-) enlarged thyroid, (-) neck pain. Respiratory: + cough, (-) bloody sputum, + shortness of breath, - wheezing. Cardiovascular: - ankle swelling, (-) chest pain. Lymphatic: (-) lymph node enlargement. Neurologic: (-) numbness, (-) tingling. Psychiatric: (-) anxiety, (-) depression   Current Medication: Outpatient Encounter Medications as of 07/19/2019  Medication Sig  . Choline Fenofibrate (FENOFIBRIC ACID) 135 MG CPDR Take 1 capsule by mouth daily.  Marland Kitchen lisinopril-hydrochlorothiazide (ZESTORETIC) 20-25 MG tablet Take 1 tablet by mouth daily.  . simvastatin (ZOCOR) 20 MG tablet Take 20 mg by mouth every morning.   . simvastatin (ZOCOR) 40 MG tablet Take 40 mg by mouth at bedtime.  Marland Kitchen amLODipine-olmesartan (AZOR) 10-40 MG tablet Take 0.5 tablets by mouth daily.  Marland Kitchen docusate sodium (COLACE) 100 MG capsule Take 2 capsules (200 mg total) by mouth 2 (two) times daily. (Patient not taking: Reported on 01/19/2017)  . HYDROcodone-acetaminophen (NORCO) 10-325 MG tablet Take 1-2 tablets by mouth every 4 (four) hours as needed for moderate pain. Maximum dose per 24 hours -8  pills. (Patient not taking: Reported on 01/19/2017)  . hydrocortisone (ANUSOL-HC) 25 MG suppository Place 1 suppository (25 mg total) rectally 2 (two) times daily. (Patient not taking: Reported on 08/27/2016)  . hydrocortisone-pramoxine (ANALPRAM HC) 2.5-1 % rectal cream Place 1 application rectally 2 (two) times daily. (Patient not taking: Reported on 08/27/2016)  . Meth-Hyo-M Bl-Na Phos-Ph Sal (URIBEL) 118 MG CAPS Take 1 mg by mouth every 4 (four) hours.  . Meth-Hyo-M Bl-Na Phos-Ph Sal (URIBEL) 118 MG CAPS Take 1 capsule (118 mg total) by mouth every 6 (six) hours as needed (dysuria). (Patient not taking: Reported on 07/19/2019)  . oxyCODONE-acetaminophen (PERCOCET) 10-325 MG tablet Take 1-2 tablets by mouth every 4 (four) hours as needed for pain. Maximum dose per 24 hours - 8 pills (Patient not taking: Reported on 07/19/2019)   No facility-administered encounter medications on file as of 07/19/2019.    Surgical History: Past Surgical History:  Procedure Laterality Date  . COLONOSCOPY    . CYSTOSCOPY WITH DIRECT VISION INTERNAL URETHROTOMY N/A 01/20/2017   Procedure: CYSTOSCOPY INTERNAL URETHROTOMY;  Surgeon: Royston Cowper, MD;  Location: [facility] ORS;  Service: Urology;  Laterality: N/A;  . HOLMIUM LASER APPLICATION N/A [id number]   Procedure: HOLMIUM LASER APPLICATION;  Surgeon: Royston Cowper, MD;  Location: [facility] ORS;  Service: Urology;  Laterality: N/A;  . PROSTATE BIOPSY     IN OFFICE  . PROSTATE BIOPSY N/A 09/07/2016   Procedure: PROSTATE BIOPSY WITH URO NAVIGATION;  Surgeon: Royston Cowper, MD;  Location: [facility] ORS;  Service: Urology;  Laterality: N/A;  . VASECTOMY      Medical History: Past Medical  History:  Diagnosis Date  . Enlarged prostate   . Hypertension     Family History: Family History  Problem Relation Age of Onset  . Prostate cancer Father     Social History: Social History   Socioeconomic History  . Marital status: Married    Spouse name: Not on file  .  Number of children: Not on file  . Years of education: Not on file  . Highest education level: Not on file  Occupational History  . Not on file  Tobacco Use  . Smoking status: Never Smoker  . Smokeless tobacco: Never Used  Substance and Sexual Activity  . Alcohol use: Yes    Comment: WINE AND BEER OCC  . Drug use: No  . Sexual activity: Not on file  Other Topics Concern  . Not on file  Social History Narrative  . Not on file   Social Determinants of Health   Financial Resource Strain:   . Difficulty of Paying Living Expenses:   Food Insecurity:   . Worried About Charity fundraiser in the Last Year:   . Arboriculturist in the Last Year:   Transportation Needs:   . Film/video editor (Medical):   Marland Kitchen Lack of Transportation (Non-Medical):   Physical Activity:   . Days of Exercise per Week:   . Minutes of Exercise per Session:   Stress:   . Feeling of Stress :   Social Connections:   . Frequency of Communication with Friends and Family:   . Frequency of Social Gatherings with Friends and Family:   . Attends Religious Services:   . Active Member of Clubs or Organizations:   . Attends Archivist Meetings:   Marland Kitchen Marital Status:   Intimate Partner Violence:   . Fear of Current or Ex-Partner:   . Emotionally Abused:   Marland Kitchen Physically Abused:   . Sexually Abused:     Vital Signs: Blood pressure 127/66, pulse 75, temperature 97.8 F (36.6 C), resp. rate 16, height 5\' 11"  (1.803 m), weight 212 lb (96.2 kg), SpO2 96 %.  Examination: General Appearance: The patient is well-developed, well-nourished, and in no distress. Skin: Gross inspection of skin unremarkable. Head: normocephalic, no gross deformities. Eyes: no gross deformities noted. ENT: ears appear grossly normal no exudates. Neck: Supple. No thyromegaly. No LAD. Respiratory: course breath sounds in bases. Cardiovascular: Normal S1 and S2 without murmur or rub. Extremities: No cyanosis. pulses are  equal. Neurologic: Alert and oriented. No involuntary movements.  LABS: No results found for this or any previous visit (from the past 2160 hour(s)).  Radiology: No results found.  No results found.  No results found.    Assessment and Plan: Patient Active Problem List   Diagnosis Date Noted  . Anal fissure 06/01/2016  . Grade I hemorrhoids 06/01/2016    1. Pneumonia due to COVID-19 virus Nebulizer prescribed, and Pulmicort nebs given.  Use nebs as instructed, and follow up if no improvement. - For home use only DME Nebulizer machine - budesonide (PULMICORT) 0.5 MG/2ML nebulizer solution; Take 2 mLs (0.5 mg total) by nebulization every 6 (six) hours as needed.  Dispense: 320 mL; Refill: 1  2. Cough Use cough medications as discussed.  Reviewed risks and possible side effects associated with taking opiates, benzodiazepines and other CNS depressants. Combination of these could cause dizziness and drowsiness. Advised patient not to drive or operate machinery when taking these medications, as patient's and other's life can be at risk  and will have consequences. Patient verbalized understanding in this matter. Dependence and abuse for these drugs will be monitored closely. A Controlled substance policy and procedure is on file which allows [facility] to order a urine drug screen test at any visit. Patient understands and agrees with the plan - chlorpheniramine-HYDROcodone (TUSSIONEX PENNKINETIC ER) 10-8 MG/5ML SUER; Take 5 mLs by mouth every 12 (twelve) hours as needed for cough.  Dispense: 70 mL; Refill: 0 - benzonatate (TESSALON) 200 MG capsule; Take 1 capsule (200 mg total) by mouth 3 (three) times daily as needed for cough.  Dispense: 30 capsule; Refill: 0  3. Essential hypertension Controlled, continue current management.  General Counseling: I have discussed the findings of the evaluation and examination with Belenda Cruise.  I have also discussed any further diagnostic  evaluation thatmay be needed or ordered today. Parson verbalizes understanding of the findings of todays visit. We also reviewed his medications today and discussed drug interactions and side effects including but not limited excessive drowsiness and altered mental states. We also discussed that there is always a risk not just to him but also people around him. he has been encouraged to call the office with any questions or concerns that should arise related to todays visit.  No orders of the defined types were placed in this encounter.    Time spent: 25 This patient was seen by Orson Gear AGNP-C in Collaboration with Dr. Devona Konig as a part of collaborative care agreement.   I have personally obtained a history, examined the patient, evaluated laboratory and imaging results, formulated the assessment and plan and placed orders.    Allyne Gee, MD [facility] Pulmonary and Critical Care Sleep medicine

## 2019-07-19 NOTE — Telephone Encounter (Signed)
Spoke with kelly that gave pt nebulizer and put order in epic

## 2019-09-04 ENCOUNTER — Ambulatory Visit: Payer: BC Managed Care – PPO | Admitting: Internal Medicine

## 2019-10-18 ENCOUNTER — Other Ambulatory Visit: Payer: Self-pay | Admitting: Urology

## 2019-10-18 DIAGNOSIS — C61 Malignant neoplasm of prostate: Secondary | ICD-10-CM

## 2019-10-18 DIAGNOSIS — R972 Elevated prostate specific antigen [PSA]: Secondary | ICD-10-CM

## 2019-11-07 ENCOUNTER — Other Ambulatory Visit: Payer: BLUE CROSS/BLUE SHIELD

## 2019-11-07 ENCOUNTER — Ambulatory Visit: Payer: BLUE CROSS/BLUE SHIELD

## 2019-11-13 ENCOUNTER — Ambulatory Visit
Admission: RE | Admit: 2019-11-13 | Discharge: 2019-11-13 | Disposition: A | Discharge status: Home / Self Care | Payer: BC Managed Care – PPO | Source: Ambulatory Visit | Attending: Urology | Admitting: Urology

## 2019-11-13 ENCOUNTER — Other Ambulatory Visit: Payer: Self-pay

## 2019-11-13 DIAGNOSIS — C61 Malignant neoplasm of prostate: Secondary | ICD-10-CM | POA: Insufficient documentation

## 2019-11-13 DIAGNOSIS — R972 Elevated prostate specific antigen [PSA]: Secondary | ICD-10-CM

## 2019-11-13 LAB — POCT I-STAT CREATININE: Creatinine, Ser: 1.4 mg/dL — ABNORMAL HIGH (ref 0.61–1.24)

## 2019-11-13 MED ORDER — IOHEXOL 350 MG/ML SOLN: 100.0000 mL | Freq: Once | INTRAVENOUS | Status: DC | PRN

## 2019-11-13 MED ORDER — GADOBUTROL 1 MMOL/ML IV SOLN
9.0000 mL | Freq: Once | INTRAVENOUS | Status: AC | PRN
  Administered 2019-11-13: 10:00:00 9 mL via INTRAVENOUS

## 2019-11-13 MED ORDER — IOHEXOL 300 MG/ML  SOLN
100.0000 mL | Freq: Once | INTRAMUSCULAR | Status: AC | PRN
  Administered 2019-11-13: 11:00:00 100 mL via INTRAVENOUS

## 2020-01-14 ENCOUNTER — Ambulatory Visit: Payer: BC Managed Care – PPO | Admitting: Podiatry

## 2020-01-14 ENCOUNTER — Other Ambulatory Visit: Payer: Self-pay | Admitting: Podiatry

## 2020-01-14 ENCOUNTER — Other Ambulatory Visit: Payer: Self-pay

## 2020-01-14 ENCOUNTER — Ambulatory Visit (INDEPENDENT_AMBULATORY_CARE_PROVIDER_SITE_OTHER): Payer: BC Managed Care – PPO

## 2020-01-14 DIAGNOSIS — M778 Other enthesopathies, not elsewhere classified: Secondary | ICD-10-CM

## 2020-01-14 MED ORDER — MELOXICAM 15 MG PO TABS: 15.0000 mg | ORAL_TABLET | Freq: Every day | ORAL | 3 refills | Status: AC

## 2020-01-14 MED ORDER — METHYLPREDNISOLONE 4 MG PO TBPK: ORAL_TABLET | ORAL | 0 refills | Status: AC

## 2020-01-14 NOTE — Progress Notes (Signed)
Subjective:  Patient ID: Stephen Joyce, male    DOB: 05-22-1965,  MRN: [id number] HPI No chief complaint on file.   54 y.o. male presents with the above complaint.   ROS: He denies fever chills nausea vomiting muscle aches pains calf pain back pain chest pain shortness of breath.  Past Medical History:  Diagnosis Date  . Cancer [facility]) 2018   prostate  . Enlarged prostate   . Hypertension    Past Surgical History:  Procedure Laterality Date  . COLONOSCOPY    . CYSTOSCOPY WITH DIRECT VISION INTERNAL URETHROTOMY N/A 01/20/2017   Procedure: CYSTOSCOPY INTERNAL URETHROTOMY;  Surgeon: Royston Cowper, MD;  Location: [facility] ORS;  Service: Urology;  Laterality: N/A;  . HOLMIUM LASER APPLICATION N/A 08/10/7351   Procedure: HOLMIUM LASER APPLICATION;  Surgeon: Royston Cowper, MD;  Location: [facility] ORS;  Service: Urology;  Laterality: N/A;  . PROSTATE BIOPSY     IN OFFICE  . PROSTATE BIOPSY N/A 09/07/2016   Procedure: PROSTATE BIOPSY WITH URO NAVIGATION;  Surgeon: Royston Cowper, MD;  Location: [facility] ORS;  Service: Urology;  Laterality: N/A;  . VASECTOMY      Current Outpatient Medications:  .  Choline Fenofibrate (FENOFIBRIC ACID) 135 MG CPDR, Take 1 capsule by mouth daily., Disp: , Rfl:  .  lisinopril-hydrochlorothiazide (ZESTORETIC) 20-25 MG tablet, Take 1 tablet by mouth daily., Disp: , Rfl:  .  meloxicam (MOBIC) 15 MG tablet, Take 1 tablet (15 mg total) by mouth daily., Disp: 30 tablet, Rfl: 3 .  methylPREDNISolone (MEDROL DOSEPAK) 4 MG TBPK tablet, 6 day dose pack - take as directed, Disp: 21 tablet, Rfl: 0 .  Omega-3 Fatty Acids (FISH OIL) 1000 MG CAPS, Take by mouth., Disp: , Rfl:  .  simvastatin (ZOCOR) 40 MG tablet, Take 40 mg by mouth at bedtime., Disp: , Rfl:  .  tadalafil (CIALIS) 5 MG tablet, Take 5 mg by mouth daily., Disp: , Rfl:   No Known Allergies Review of Systems Objective:  There were no vitals filed for this visit.  General: Well developed, nourished, in no  acute distress, alert and oriented x3   Dermatological: Skin is warm, dry and supple bilateral. Nails x 10 are well maintained; remaining integument appears unremarkable at this time. There are no open sores, no preulcerative lesions, no rash or signs of infection present.  Vascular: Dorsalis Pedis artery and Posterior Tibial artery pedal pulses are 2/4 bilateral with immedate capillary fill time. Pedal hair growth present. No varicosities and no lower extremity edema present bilateral.   Neruologic: Grossly intact via light touch bilateral. Vibratory intact via tuning fork bilateral. Protective threshold with Semmes Wienstein monofilament intact to all pedal sites bilateral. Patellar and Achilles deep tendon reflexes 2+ bilateral. No Babinski or clonus noted bilateral.   Musculoskeletal: No gross boney pedal deformities bilateral. No pain, crepitus, or limitation noted with foot and ankle range of motion bilateral. Muscular strength 5/5 in all groups tested bilateral.  Pain on palpation and end range of motion of the second and third metatarsophalangeal joints of the right foot.  History of plantar fascial rupture right.  Gait: Unassisted, Nonantalgic.    Radiographs:  Radiographs taken today demonstrate a rectus foot type no fractures are identified.  Minimal swelling overlying the forefoot.  Assessment & Plan:   Assessment: Capsulitis forefoot right second and third metatarsophalangeal joints.  Plan: Discussed etiology pathology conservative surgical therapies at this point time went ahead and injected his second interdigital space with 10 mg  Kenalog 5 mg of Marcaine.  Start him on Medrol Dosepak to be followed by meloxicam.  Also discussed in great detail today the use of stiff soled shoes and I will follow-up with him in 1 month.     Junelle Hashemi T. [place], [place]

## 2020-02-13 ENCOUNTER — Encounter: Payer: Self-pay | Admitting: Podiatry

## 2020-02-13 ENCOUNTER — Other Ambulatory Visit: Payer: Self-pay

## 2020-02-13 ENCOUNTER — Ambulatory Visit: Payer: BC Managed Care – PPO | Admitting: Podiatry

## 2020-02-13 DIAGNOSIS — M778 Other enthesopathies, not elsewhere classified: Secondary | ICD-10-CM | POA: Diagnosis not present

## 2020-02-13 NOTE — Progress Notes (Signed)
He presents today for follow-up of capsulitis second metatarsophalangeal joint of the right foot.  He states that is 100% improved he went to [employer] and walked about 40 miles in 4 to 5days and states that is done fine after that.  He takes his meloxicam if he feels like he needs it he is purchased new shoes.  Objective: Vital signs are stable he is alert and oriented x3 there is no erythema edema cellulitis drainage or odor no swelling around the second metatarsophalangeal joint no pain on end range of motion.  Assessment: Well-healing capsulitis second metatarsophalangeal joint.  Plan: Discussed etiology pathology conservative versus surgical therapies for this right foot he understands that should this start to come back again he will start back on his meloxicam and we did discuss evaluating shoe gear at that point if conservative therapies fail at home he will notify us within a week.

## 2021-04-24 ENCOUNTER — Encounter: Payer: Self-pay | Admitting: Internal Medicine

## 2021-05-17 IMAGING — MR MR PROSTATE WO/W CM
56 series · 56 of 56 positions shown · IV contrast (9ml Gadavist)
Comparison: 05/19/2016

CLINICAL DATA: Biochemical recurrence. Prostate carcinoma, Gleason
score 3+4=7. Previous HIFU treatment.

EXAM:
MR PROSTATE WITHOUT AND WITH CONTRAST
TECHNIQUE: Multiplanar multisequence MRI images were obtained of the pelvis
centered about the prostate. Pre and post contrast images were
obtained.
CONTRAST:  9mL GADAVIST GADOBUTROL 1 MMOL/ML IV SOLN

[Series 3: ax in&out whole · axial · 5.0mm · 0.74mm/px · 1 of 70 slices shown]
[im 1/70]
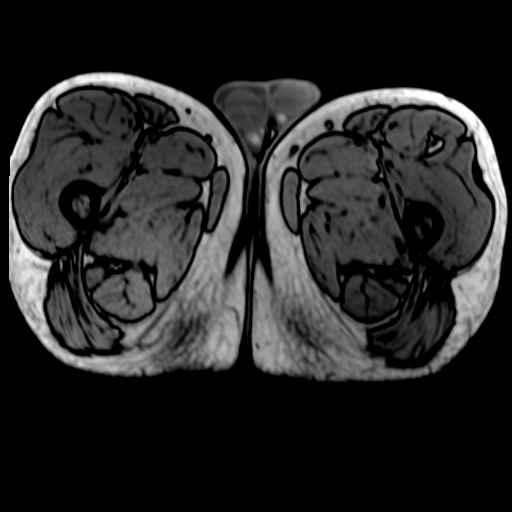

[Series 4: T2 · axial · 3.0mm · 0.56mm/px · 1 of 25 slices shown (1 of 3)]
[im 1/25]
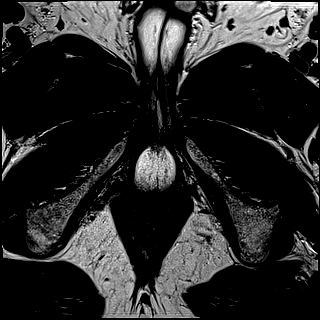

[Series 6: T2 · coronal · 3.0mm · 0.70mm/px · 1 of 35 slices shown (2 of 3)]
[im 1/35]
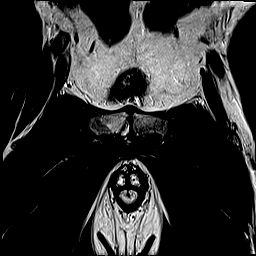

[Series 7: DWI · axial · 3.0mm · 0.86mm/px · 1 of 75 slices shown (1 of 3)]
[im 1/75]
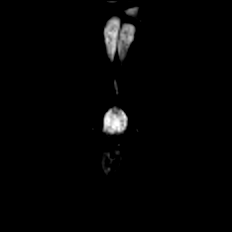

[Series 8: DWI · axial · 3.0mm · 0.86mm/px · 1 of 25 slices shown (2 of 3)]
[im 1/25]
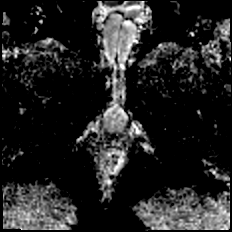

[Series 9: DWI · axial · 3.0mm · 0.86mm/px · 1 of 25 slices shown (3 of 3)]
[im 1/25]
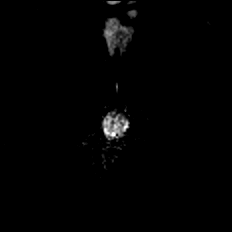

[Series 10: T2 · axial · 1.0mm · 1.04mm/px · 1 of 72 slices shown (3 of 3)]
[im 1/72]
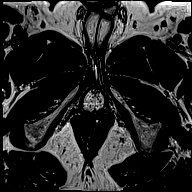

[Series 11: T1 · axial · 3.0mm · 1.15mm/px · 1 of 28 slices shown (1 of 49)]
[im 1/28]
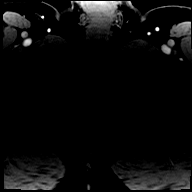

[Series 12: T1 · axial · 3.0mm · 1.15mm/px · 1 of 28 slices shown (2 of 49)]
[im 1/28]
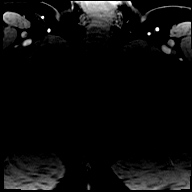

[Series 13: T1 · axial · 3.0mm · 1.15mm/px · 1 of 28 slices shown (3 of 49)]
[im 1/28]
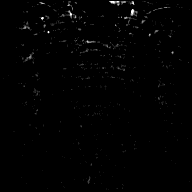

[Series 14: T1 · axial · 3.0mm · 1.15mm/px · 1 of 28 slices shown (4 of 49)]
[im 1/28]
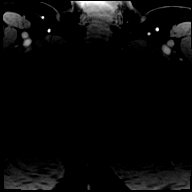

[Series 15: T1 · axial · 3.0mm · 1.15mm/px · 1 of 28 slices shown (5 of 49)]
[im 1/28]
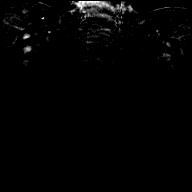

[Series 16: T1 · axial · 3.0mm · 1.15mm/px · 1 of 28 slices shown (6 of 49)]
[im 1/28]
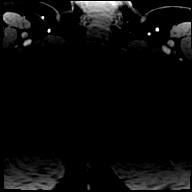

[Series 17: T1 · axial · 3.0mm · 1.15mm/px · 1 of 28 slices shown (7 of 49)]
[im 1/28]
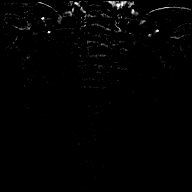

[Series 18: T1 · axial · 3.0mm · 1.15mm/px · 1 of 28 slices shown (8 of 49)]
[im 1/28]
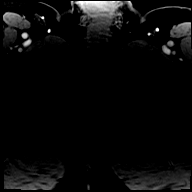

[Series 19: T1 · axial · 3.0mm · 1.15mm/px · 1 of 28 slices shown (9 of 49)]
[im 1/28]
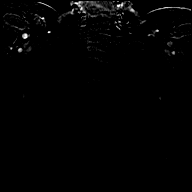

[Series 20: T1 · axial · 3.0mm · 1.15mm/px · 1 of 28 slices shown (10 of 49)]
[im 1/28]
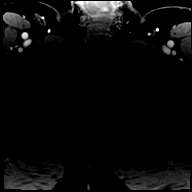

[Series 21: T1 · axial · 3.0mm · 1.15mm/px · 1 of 28 slices shown (11 of 49)]
[im 1/28]
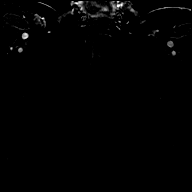

[Series 22: T1 · axial · 3.0mm · 1.15mm/px · 1 of 28 slices shown (12 of 49)]
[im 1/28]
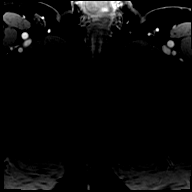

[Series 23: T1 · axial · 3.0mm · 1.15mm/px · 1 of 28 slices shown (13 of 49)]
[im 1/28]
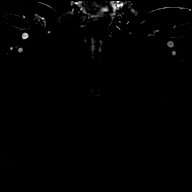

[Series 24: T1 · axial · 3.0mm · 1.15mm/px · 1 of 28 slices shown (14 of 49)]
[im 1/28]
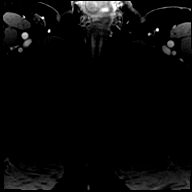

[Series 25: T1 · axial · 3.0mm · 1.15mm/px · 1 of 28 slices shown (15 of 49)]
[im 1/28]
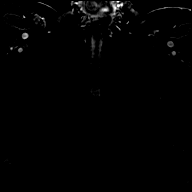

[Series 26: T1 · axial · 3.0mm · 1.15mm/px · 1 of 28 slices shown (16 of 49)]
[im 1/28]
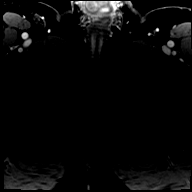

[Series 27: T1 · axial · 3.0mm · 1.15mm/px · 1 of 28 slices shown (17 of 49)]
[im 1/28]
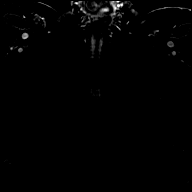

[Series 28: T1 · axial · 3.0mm · 1.15mm/px · 1 of 28 slices shown (18 of 49)]
[im 1/28]
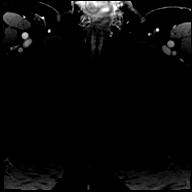

[Series 29: T1 · axial · 3.0mm · 1.15mm/px · 1 of 28 slices shown (19 of 49)]
[im 1/28]
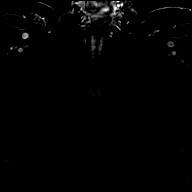

[Series 30: T1 · axial · 3.0mm · 1.15mm/px · 1 of 28 slices shown (20 of 49)]
[im 1/28]
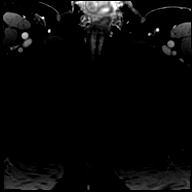

[Series 31: T1 · axial · 3.0mm · 1.15mm/px · 1 of 28 slices shown (21 of 49)]
[im 1/28]
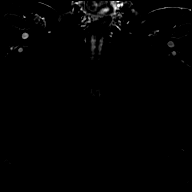

[Series 32: T1 · axial · 3.0mm · 1.15mm/px · 1 of 28 slices shown (22 of 49)]
[im 1/28]
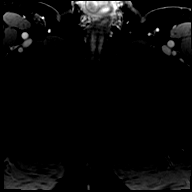

[Series 33: T1 · axial · 3.0mm · 1.15mm/px · 1 of 28 slices shown (23 of 49)]
[im 1/28]
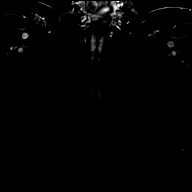

[Series 34: T1 · axial · 3.0mm · 1.15mm/px · 1 of 28 slices shown (24 of 49)]
[im 1/28]
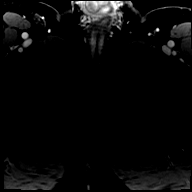

[Series 35: T1 · axial · 3.0mm · 1.15mm/px · 1 of 28 slices shown (25 of 49)]
[im 1/28]
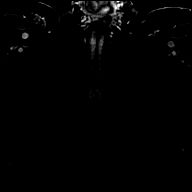

[Series 36: T1 · axial · 3.0mm · 1.15mm/px · 1 of 28 slices shown (26 of 49)]
[im 1/28]
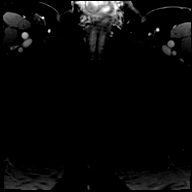

[Series 37: T1 · axial · 3.0mm · 1.15mm/px · 1 of 28 slices shown (27 of 49)]
[im 1/28]
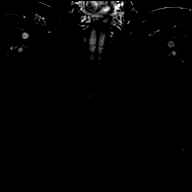

[Series 38: T1 · axial · 3.0mm · 1.15mm/px · 1 of 28 slices shown (28 of 49)]
[im 1/28]
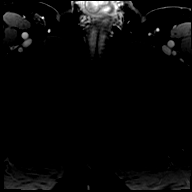

[Series 39: T1 · axial · 3.0mm · 1.15mm/px · 1 of 28 slices shown (29 of 49)]
[im 1/28]
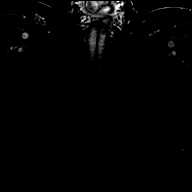

[Series 40: T1 · axial · 3.0mm · 1.15mm/px · 1 of 28 slices shown (30 of 49)]
[im 1/28]
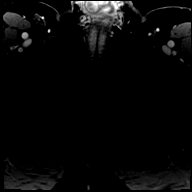

[Series 41: T1 · axial · 3.0mm · 1.15mm/px · 1 of 28 slices shown (31 of 49)]
[im 1/28]
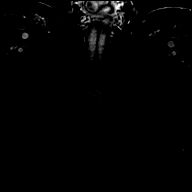

[Series 42: T1 · axial · 3.0mm · 1.15mm/px · 1 of 28 slices shown (32 of 49)]
[im 1/28]
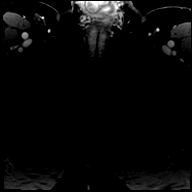

[Series 43: T1 · axial · 3.0mm · 1.15mm/px · 1 of 28 slices shown (33 of 49)]
[im 1/28]
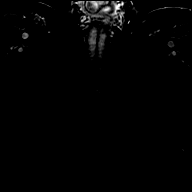

[Series 44: T1 · axial · 3.0mm · 1.15mm/px · 1 of 28 slices shown (34 of 49)]
[im 1/28]
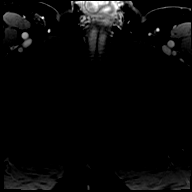

[Series 45: T1 · axial · 3.0mm · 1.15mm/px · 1 of 28 slices shown (35 of 49)]
[im 1/28]
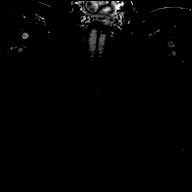

[Series 46: T1 · axial · 3.0mm · 1.15mm/px · 1 of 28 slices shown (36 of 49)]
[im 1/28]
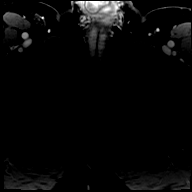

[Series 47: T1 · axial · 3.0mm · 1.15mm/px · 1 of 28 slices shown (37 of 49)]
[im 1/28]
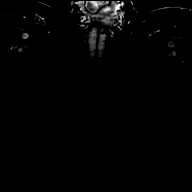

[Series 48: T1 · axial · 3.0mm · 1.15mm/px · 1 of 28 slices shown (38 of 49)]
[im 1/28]
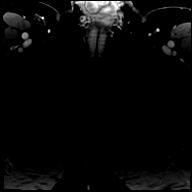

[Series 49: T1 · axial · 3.0mm · 1.15mm/px · 1 of 28 slices shown (39 of 49)]
[im 1/28]
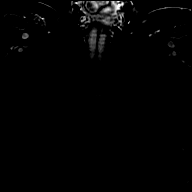

[Series 50: T1 · axial · 3.0mm · 1.15mm/px · 1 of 28 slices shown (40 of 49)]
[im 1/28]
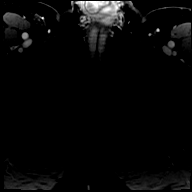

[Series 51: T1 · axial · 3.0mm · 1.15mm/px · 1 of 28 slices shown (41 of 49)]
[im 1/28]
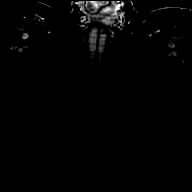

[Series 52: T1 · axial · 3.0mm · 1.15mm/px · 1 of 28 slices shown (42 of 49)]
[im 1/28]
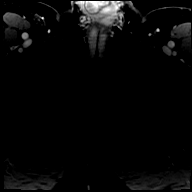

[Series 53: T1 · axial · 3.0mm · 1.15mm/px · 1 of 28 slices shown (43 of 49)]
[im 1/28]
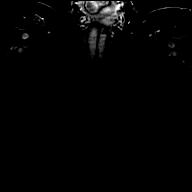

[Series 54: T1 · axial · 3.0mm · 1.15mm/px · 1 of 28 slices shown (44 of 49)]
[im 1/28]
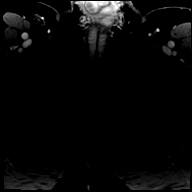

[Series 55: T1 · axial · 3.0mm · 1.15mm/px · 1 of 28 slices shown (45 of 49)]
[im 1/28]
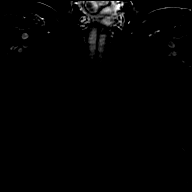

[Series 56: T1 · axial · 3.0mm · 1.15mm/px · 1 of 28 slices shown (46 of 49)]
[im 1/28]
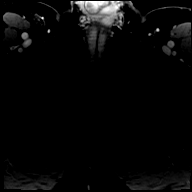

[Series 57: T1 · axial · 3.0mm · 1.15mm/px · 1 of 28 slices shown (47 of 49)]
[im 1/28]
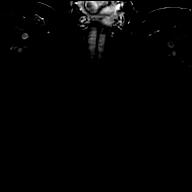

[Series 58: T1 · axial · 3.0mm · 1.15mm/px · 1 of 28 slices shown (48 of 49)]
[im 1/28]
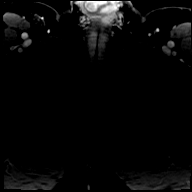

[Series 59: T1 · axial · 3.0mm · 1.15mm/px · 1 of 28 slices shown (49 of 49)]
[im 1/28]
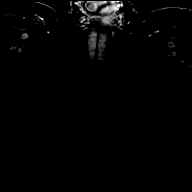

[56 of 56 positions shown; findings below may reference images not displayed]

FINDINGS: Prostate: HIFU procedure has been performed since previous study,
with very small amount of residual tissue seen in the prostate bed.
No focal areas of restricted diffusion or early focal contrast
enhancement are seen in the prostate bed.

Transcapsular spread:  None visualized

Seminal vesicle involvement: Absent; atrophy of the left seminal
vesicle noted.

Neurovascular bundle involvement:  Absent

Pelvic adenopathy: None visualized

Bone metastasis: None visualized

Other:  Sigmoid diverticulosis, without evidence of diverticulitis.
IMPRESSION: Postop changes from previous HIFU procedure. No radiographic
evidence of locally recurrent high-grade carcinoma, or pelvic
metastatic disease. PI-RADS 2: Low (clinically significant cancer is
unlikely to be present)
